# Patient Record
Sex: Male | Born: 2000 | Race: White | Hispanic: No | Marital: Single | Smoking: Never smoker
Health system: Southern US, Community
[De-identification: ages and names within clinical notes are randomized; demographics above are authoritative.]

---
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
## 2023-03-05 ENCOUNTER — Other Ambulatory Visit: Payer: Self-pay

## 2023-03-05 ENCOUNTER — Encounter: Payer: Self-pay | Admitting: Emergency Medicine

## 2023-03-05 DIAGNOSIS — E860 Dehydration: Secondary | ICD-10-CM | POA: Diagnosis not present

## 2023-03-05 DIAGNOSIS — R55 Syncope and collapse: Secondary | ICD-10-CM | POA: Insufficient documentation

## 2023-03-05 DIAGNOSIS — D72829 Elevated white blood cell count, unspecified: Secondary | ICD-10-CM | POA: Diagnosis not present

## 2023-03-05 DIAGNOSIS — R000 Tachycardia, unspecified: Secondary | ICD-10-CM | POA: Insufficient documentation

## 2023-03-05 NOTE — ED Notes (Signed)
Pt had an episode of tremor like behavior in triage - pt able to speak throughout and states that he has ringing in both ears with associated heaviness.

## 2023-03-05 NOTE — ED Notes (Signed)
First Nurse note: pt BIB ems with c/o syncopal episode tonight.  Ems states pt was bit by black ants this afternoon, and was in a cold pool.  Pt has hx of mast cell activation per ems.  Pt reportedly vomited at some point this evening, lost consciousness and was then given benadryl d/t a rash after waking back up.    EMS VS: 104 HR  140/83 100%RA

## 2023-03-05 NOTE — ED Triage Notes (Signed)
Pt presents via [facility] for a syncopal episode tonight. Pt was helping a friend when he broke out in hives due to a skin condition and passed out - unsure if he hit his head but he does endorses a mild headache. A&Ox4 at this time. Denies CP or SOB.

## 2023-03-06 ENCOUNTER — Emergency Department
Admission: EM | Admit: 2023-03-06 | Discharge: 2023-03-06 | Disposition: A | Discharge status: Home / Self Care | Payer: Medicaid Other | Attending: Emergency Medicine | Admitting: Emergency Medicine

## 2023-03-06 DIAGNOSIS — E860 Dehydration: Secondary | ICD-10-CM

## 2023-03-06 DIAGNOSIS — R55 Syncope and collapse: Secondary | ICD-10-CM

## 2023-03-06 LAB — COMPREHENSIVE METABOLIC PANEL
ALT: 37 U/L (ref 0–44)
AST: 19 U/L (ref 15–41)
Albumin: 4.2 g/dL (ref 3.5–5.0)
Alkaline Phosphatase: 60 U/L (ref 38–126)
Anion gap: 9 (ref 5–15)
BUN: 16 mg/dL (ref 6–20)
CO2: 24 mmol/L (ref 22–32)
Calcium: 8.7 mg/dL — ABNORMAL LOW (ref 8.9–10.3)
Chloride: 103 mmol/L (ref 98–111)
Creatinine, Ser: 0.94 mg/dL (ref 0.61–1.24)
GFR, Estimated: >60 mL/min (ref >60)
Glucose, Bld: 132 mg/dL — ABNORMAL HIGH (ref 70–99)
Potassium: 4.2 mmol/L (ref 3.5–5.1)
Sodium: 136 mmol/L (ref 135–145)
Total Bilirubin: 0.8 mg/dL (ref 0.3–1.2)
Total Protein: 7.2 g/dL (ref 6.5–8.1)

## 2023-03-06 LAB — CBC
HCT: 53.3 % — ABNORMAL HIGH (ref 39.0–52.0)
Hemoglobin: 17.9 g/dL — ABNORMAL HIGH (ref 13.0–17.0)
MCH: 28.7 pg (ref 26.0–34.0)
MCHC: 33.6 g/dL (ref 30.0–36.0)
MCV: 85.6 fL (ref 80.0–100.0)
Platelets: 382 10*3/uL (ref 150–400)
RBC: 6.23 MIL/uL — ABNORMAL HIGH (ref 4.22–5.81)
RDW: 12.2 % (ref 11.5–15.5)
WBC: 18.8 10*3/uL — ABNORMAL HIGH (ref 4.0–10.5)
nRBC: 0 % (ref 0.0–0.2)

## 2023-03-06 LAB — CK: Total CK: 179 U/L (ref 49–397)

## 2023-03-06 LAB — TROPONIN I (HIGH SENSITIVITY): Troponin I (High Sensitivity): 3 ng/L (ref <18)

## 2023-03-06 MED ORDER — ONDANSETRON HCL 4 MG/2ML IJ SOLN
4.0000 mg | INTRAMUSCULAR | Status: AC
  Administered 2023-03-06: 4 mg via INTRAVENOUS
  Filled 2023-03-06: qty 2

## 2023-03-06 MED ORDER — LACTATED RINGERS IV BOLUS
1000.0000 mL | Freq: Once | INTRAVENOUS | Status: AC
  Administered 2023-03-06: 1000 mL via INTRAVENOUS

## 2023-03-06 NOTE — Discharge Instructions (Addendum)

## 2023-03-06 NOTE — ED Provider Notes (Signed)
[facility] Provider Note    Event Date/Time   First MD Initiated Contact with Patient 03/06/23 0012     (approximate)   History   Loss of Consciousness   HPI Samuel Larsen is a 22 y.o. male with no chronic medical issues other than obesity.  He presents after syncopal episode tonight.  He was traveling today with his parents and then working outside for an extended period of time including working this evening to help a friend clean out a swimming pool.  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  This happened to him this evening and he did pass out and fall, uncertain if he struck his head.  He was given 2 Benadryl and ate something but then vomited it back up.  The patient said he feels tired right now but otherwise feels fine.  No persistent nausea, no headache, no neck pain.  No numbness nor weakness in his extremities.  His hives have completely resolved and his parents report that this presentation is very similar to what has happened multiple times in the past.  He has had no chest pain or shortness of breath.  No abdominal pain.  No unilateral leg pain or swelling.     Physical Exam   Triage Vital Signs: ED Triage Vitals  Enc Vitals Group     BP 03/05/23 2347 129/79     Pulse Rate 03/05/23 2347 (!) 115     Resp 03/05/23 2347 19     Temp 03/05/23 2347 98.4 F (36.9 C)     Temp Source 03/05/23 2347 Oral     SpO2 03/05/23 2347 97 %     Weight 03/05/23 2345 113.4 kg (250 lb)     Height 03/05/23 2345 1.753 m (5\' 9" )     Head Circumference --      Peak Flow --      Pain Score 03/05/23 2348 6     Pain Loc --      Pain Edu? --      Excl. in GC? --     Most recent vital signs: Vitals:   03/05/23 2347  BP: 129/79  Pulse: (!) 115  Resp: 19  Temp: 98.4 F (36.9 C)   SpO2: 97%    General: Awake, no distress.  Appears tired and pale. CV:  Good peripheral perfusion.  Tachycardia, normal heart sounds, regular rhythm. Resp:  Normal effort. Speaking easily and comfortably, no accessory muscle usage nor intercostal retractions.  Lung is clear to auscultation. Abd:  No distention.  Obese, no tenderness to palpation. Other:  No urticaria.  No evidence of trauma to his head.  No tenderness to palpation of the cervical spine and no pain or tenderness with flexion, extension, and rotation of the head and neck from side-to-side.   ED Results / Procedures / Treatments   Labs (all labs ordered are listed, but only abnormal results are displayed) Labs Reviewed  CBC - Abnormal; Notable for the following components:      Result Value   WBC 18.8 (*)    RBC 6.23 (*)    Hemoglobin 17.9 (*)    HCT 53.3 (*)    All other components within normal limits  COMPREHENSIVE METABOLIC PANEL - Abnormal; Notable for the following components:  Glucose, Bld 132 (*)    Calcium 8.7 (*)    All other components within normal limits  CK  TROPONIN I (HIGH SENSITIVITY)     EKG  ED ECG REPORT I, Loleta Rose, the attending physician, personally viewed and interpreted this ECG.  Date: 03/05/2023 EKG Time: 23: 47 Rate: 114 Rhythm: Sinus tachycardia QRS Axis: Right axis deviation Intervals: normal ST/T Wave abnormalities: normal Narrative Interpretation: no evidence of acute ischemia     PROCEDURES:  Critical Care performed: No  .1-3 Lead EKG Interpretation  Performed by: Loleta Rose, MD Authorized by: Loleta Rose, MD     Interpretation: abnormal     ECG rate:  115   ECG rate assessment: tachycardic     Rhythm: sinus tachycardia     Ectopy: none     Conduction: normal       IMPRESSION / MDM / ASSESSMENT AND PLAN / ED COURSE  I reviewed the triage vital signs and the nursing notes.                              Differential diagnosis includes, but  is not limited to, nonspecific syncope and collapse, dehydration, rhabdomyolysis, acute kidney injury/dehydration, cardiac arrhythmia, PE, head injury, intracranial bleed.  Patient's presentation is most consistent with acute presentation with potential threat to life or bodily function.  Labs/studies ordered: EKG, CMP, CK, CBC, high-sensitivity troponin  Interventions/Medications given:  Medications  lactated ringers bolus 1,000 mL (1,000 mLs Intravenous New Bag/Given 03/06/23 0101)  ondansetron (ZOFRAN) injection 4 mg (4 mg Intravenous Given 03/06/23 0101)    (Note:  hospital course my include additional interventions and/or labs/studies not listed above.)   Patient slightly tachycardic, appears pale but is alert and oriented.  Patient is also a bit somnolent but I suspect this is the accumulation of exertion plus Benadryl.  No evidence of an emergent allergic reaction, suspect this was an exacerbation of his chronic condition.   No indication for head CT based on [place] head CT rules and no indication for cervical spine CT based on Nexus criteria  Labs are all reassuring except for leukocytosis of 18.8, but I suspect this is a stress reaction and/or reaction to the urticarial situation he had going on.  No evidence of rhabdomyolysis or acute kidney injury.  However he is tachycardic and likely somewhat dehydrated or at least volume depleted, so I ordered LR 1 L bolus.  Also ordered Zofran 4 mg IV since he vomited earlier and I am encouraging him to eat and drink something.  Will reassess but anticipate discharge  The patient is on the cardiac monitor to evaluate for evidence of arrhythmia and/or significant heart rate changes.  Clinical Course as of 03/06/23 0306  Tue Mar 06, 2023  1610 Patient has been resting.  Passed p.o. challenge.  Says he feels fine and is ready to go home.  Parents agree with the plan.  I gave my usual and customary follow-up recommendations and return precautions.   Heart rate has returned to normal when I reassessed him. [CF]    Clinical Course User Index [CF] Loleta Rose, MD     FINAL CLINICAL IMPRESSION(S) / ED DIAGNOSES   Final diagnoses:  Syncope and collapse  Dehydration     Rx / DC Orders   ED Discharge Orders     None        Note:  This document was prepared using Dragon voice recognition  software and may include unintentional dictation errors.   Loleta Rose, MD 03/06/23 [phone]

## 2023-03-06 NOTE — ED Notes (Signed)
Water given for PO challenge.
